# Patient Record
Sex: Male | Born: 1980 | Race: White | Hispanic: No | Marital: Married | Smoking: Never smoker
Health system: Southern US, Community
[De-identification: ages and names within clinical notes are randomized; demographics above are authoritative.]

---
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
## 2015-12-06 ENCOUNTER — Telehealth: Payer: Self-pay | Admitting: Osteopathic Medicine

## 2015-12-06 ENCOUNTER — Ambulatory Visit (INDEPENDENT_AMBULATORY_CARE_PROVIDER_SITE_OTHER): Payer: Managed Care, Other (non HMO)

## 2015-12-06 ENCOUNTER — Ambulatory Visit (INDEPENDENT_AMBULATORY_CARE_PROVIDER_SITE_OTHER): Payer: Managed Care, Other (non HMO) | Admitting: Osteopathic Medicine

## 2015-12-06 ENCOUNTER — Encounter: Payer: Self-pay | Admitting: Osteopathic Medicine

## 2015-12-06 VITALS — BP 124/77 | HR 72 | Ht 71.0 in | Wt 192.0 lb

## 2015-12-06 DIAGNOSIS — Z23 Encounter for immunization: Secondary | ICD-10-CM | POA: Diagnosis not present

## 2015-12-06 DIAGNOSIS — R3989 Other symptoms and signs involving the genitourinary system: Secondary | ICD-10-CM | POA: Diagnosis not present

## 2015-12-06 DIAGNOSIS — Z789 Other specified health status: Secondary | ICD-10-CM

## 2015-12-06 DIAGNOSIS — Z0000 Encounter for general adult medical examination without abnormal findings: Secondary | ICD-10-CM

## 2015-12-06 DIAGNOSIS — R2241 Localized swelling, mass and lump, right lower limb: Secondary | ICD-10-CM

## 2015-12-06 DIAGNOSIS — R229 Localized swelling, mass and lump, unspecified: Secondary | ICD-10-CM | POA: Diagnosis not present

## 2015-12-06 DIAGNOSIS — R17 Unspecified jaundice: Secondary | ICD-10-CM

## 2015-12-06 LAB — CBC WITH DIFFERENTIAL/PLATELET
Basophils Absolute: 0 cells/uL (ref 0–200)
Basophils Relative: 0 %
EOS PCT: 1 %
Eosinophils Absolute: 35 cells/uL (ref 15–500)
HCT: 44.4 % (ref 38.5–50.0)
HEMOGLOBIN: 15.4 g/dL (ref 13.2–17.1)
LYMPHS ABS: 1085 {cells}/uL (ref 850–3900)
Lymphocytes Relative: 31 %
MCH: 31.6 pg (ref 27.0–33.0)
MCHC: 34.7 g/dL (ref 32.0–36.0)
MCV: 91 fL (ref 80.0–100.0)
MONOS PCT: 13 %
MPV: 9.6 fL (ref 7.5–12.5)
Monocytes Absolute: 455 cells/uL (ref 200–950)
NEUTROS ABS: 1925 {cells}/uL (ref 1500–7800)
Neutrophils Relative %: 55 %
PLATELETS: 229 10*3/uL (ref 140–400)
RBC: 4.88 MIL/uL (ref 4.20–5.80)
RDW: 13.2 % (ref 11.0–15.0)
WBC: 3.5 10*3/uL — AB (ref 3.8–10.8)

## 2015-12-06 LAB — URINALYSIS, ROUTINE W REFLEX MICROSCOPIC
BILIRUBIN URINE: NEGATIVE
GLUCOSE, UA: NEGATIVE
Hgb urine dipstick: NEGATIVE
Ketones, ur: NEGATIVE
LEUKOCYTES UA: NEGATIVE
Nitrite: NEGATIVE
PH: 7.5 (ref 5.0–8.0)
PROTEIN: NEGATIVE
SPECIFIC GRAVITY, URINE: 1.017 (ref 1.001–1.035)

## 2015-12-06 NOTE — Patient Instructions (Signed)
Let's plan to follow-up here in the office in 12 months for annual wellness exam If you would like to, you can get lab work done a few days before that visit so that we can go over the results in person at your appointment. You do not need an appointment to go downstairs to the lab for blood draws, they should have the orders in the system for you and if there is any problem they can always call upstairs and we can solve it pretty quickly and get her blood drawn that day.  We will get an ultrasound to further evaluate the mass in the leg - most likely this is benign but the radiologist may recommend biopsy or other imaging based on what they see on the ultrasound. We will call you with results once these are available.   Please let us know if there is anything else we can do for you. Take care! -Dr. Loni Muse.

## 2015-12-06 NOTE — Telephone Encounter (Signed)
Spoke to patient, he does not recall any specific injury to this area of the leg which would result in a myositis ossificans mass/deformity, we'll go ahead and proceed with MRI for definitive diagnosis

## 2015-12-06 NOTE — Progress Notes (Signed)
HPI: Dylan Johnston is a 35 y.o. male who presents to [facility] today for chief complaint of:  Chief Complaint  Patient presents with  . Establish Care    ANNUAL, MASS ON RIGHT CALF     ANNUAL EXAM - SEE PREVENTIVE CARE REVIEWED AS BELOW  MASS ON R CALF . Location: R calf, lateral side . Quality: lump, occasionally sore when running   Duration: been there for years, hasn't really changed  URINARY COMPLAINT - rarely will stop peeing and zip up but then feels a few drops leak. No dribbling stream or feeling of inadequate emptying, no burning or blood in the urine     Past medical, social and family history reviewed: History reviewed. No pertinent past medical history. Past Surgical History  Procedure Laterality Date  . Tonsillectomy  1989   Social History  Substance Use Topics  . Smoking status: Never Smoker   . Smokeless tobacco: Not on file  . Alcohol Use: Not on file   Family History  Problem Relation Age of Onset  . Cancer Mother     BREAST  . Cancer Maternal Grandmother     No current outpatient prescriptions on file.   No current facility-administered medications for this visit.   No Known Allergies    Review of Systems: CONSTITUTIONAL:  No  fever, no chills, No  unintentional weight changes HEAD/EYES/EARS/NOSE/THROAT: No  headache, no vision change, no hearing change, No  sore throat, No  sinus pressure CARDIAC: No  chest pain, No  pressure, No palpitations, No  orthopnea RESPIRATORY: No  cough, No  shortness of breath/wheeze GASTROINTESTINAL: No  nausea, No  vomiting, No  abdominal pain, No  blood in stool, No  diarrhea, No  constipation  MUSCULOSKELETAL: No  myalgia/arthralgia GENITOURINARY: No  incontinence, No  abnormal genital bleeding/discharge SKIN: No  rash/wounds/concerning lesions HEM/ONC: No  easy bruising/bleeding, No  abnormal lymph node ENDOCRINE: No polyuria/polydipsia/polyphagia, No  heat/cold intolerance   NEUROLOGIC: No  weakness, No  dizziness, No  slurred speech PSYCHIATRIC: No  concerns with depression, No  concerns with anxiety, No sleep problems  Exam:  BP 124/77 mmHg  Pulse 72  Ht _0  (1.803 m)  Wt 192 lb (87.091 kg)  BMI 26.79 kg/m2 Constitutional: VS see above. General Appearance: alert, well-developed, well-nourished, NAD Eyes: Normal lids and conjunctive, non-icteric sclera, PERRLA Ears, Nose, Mouth, Throat: MMM, Normal external inspection ears/nares/mouth/lips/gums, TM normal bilaterally. Pharynx no erythema, no exudate.  Neck: No masses, trachea midline. No thyroid enlargement/tenderness/mass appreciated. No lymphadenopathy Respiratory: Normal respiratory effort. no wheeze, no rhonchi, no rales Cardiovascular: S1/S2 normal, no murmur, no rub/gallop auscultated. RRR. No lower extremity edema. Gastrointestinal: Nontender, no masses. No hepatomegaly, no splenomegaly. No hernia appreciated. Bowel sounds normal. Rectal exam deferred.  Musculoskeletal: Gait normal. No clubbing/cyanosis of digits. Subq relatively firm oblong mass near fibular head, mobile, mild tender, no overlying skin changes Neurological: No cranial nerve deficit on limited exam. Motor and sensation intact and symmetric Skin: warm, dry, intact. No rash/ulcer. No concerning nevi or subq nodules on limited exam.   Psychiatric: Normal judgment/insight. Normal mood and affect. Oriented x3.    No results found for this or any previous visit (from the past 72 hour(s)).    ASSESSMENT/PLAN: Will get [place] for further eval mass on leg though suspect benign, however feels a bit firm for lipoma, pt advised may require further evaluation but my suspicion for anything dangerous is low based on how long the lass  has been there and not really changed. Zika testing due to recent travel to endemic area and he and wife are planning to try to get pregnant. Updated Tetanus.   Annual physical exam - Plan: CBC with  Differential/Platelet, COMPLETE METABOLIC PANEL WITH GFR, TSH, Lipid panel  Subcutaneous mass - Plan: [place] Misc Soft Tissue  History of recent foreign travel - Plan: Zika Virus RNA, Qual RT-PCR Pnl,S/U  Urinary problem - Plan: Urinalysis, Routine w reflex microscopic  Need for diphtheria-tetanus-pertussis (Tdap) vaccine, adult/adolescent - Plan: Tdap vaccine greater than or equal to 7yo IM    MALE PREVENTIVE CARE  ANNUAL SCREENING/COUNSELING Tobacco - Never  Alcohol - social drinker Diet/Exercise - HEALTHY HABITS DISCUSSED TO DECREASE CV RISK Sexual Health - Yes with male. STI - The patient denies history of sexually transmitted disease. INTERESTED IN STI TESTING - Zika Depression - PQH2 Negative Domestic violence concerns - no HTN SCREENING - SEE VITALS Vaccination status - SEE BELOW  INFECTIOUS DISEASE SCREENING HIV - all adults 15-65 - does not need GC/CT - sexually active - does not need HepC - born 44-1963 - does not need TB - if risk/required by employer - does not need  DISEASE SCREENING Lipid - (Low risk screen M35; High risk screen M25 if HTN, Tob, FH CHD M<55/F<65) - needs DM2 (45+ or Risk = FH 1st deg DM, Hx GDM, overweight/sedentary, high-risk ethnicity, HTN) - needs Osteoporosis - age 19+ or one sooner if risk - does not need AAA - once age 82-75 if smoker - does not need  CANCER SCREENING Lung - annual low dose CT Chest age 56-85 w/ 30+ PY, current/quit past 15 years - CT - does not need Colon - age 79+ or 33 years of age prior to [place] Dx - GI REFERRAL - does not need Prostate - (shared decision making age 67 in average-risk, age 20 to 63 high risk black men, men with a family history of prostate cancer younger than age 26, BRCA1 or BRCA2) - does not need   ADULT VACCINATION Influenza - annual -  Td booster every 10 years - was given HPV - age <62yo - was not indicated Zoster - age 86+ - was not indicated Pneumonia - age 71+ sooner if risk (DM, smoker,  other) - was not indicated  OTHER Fall - exercise and Vit D age 75+ - does not need Consider ASA - age 44-59 - does not need   All questions were answered. Visit summary with updated medication list and pertinent instructions was printed for patient. ER/RTC precautions were reviewed with the patient. Return in about 1 year (around 12/05/2016), or sooner if needed, for [employer].

## 2015-12-07 LAB — COMPLETE METABOLIC PANEL WITH GFR
ALBUMIN: 4.5 g/dL (ref 3.6–5.1)
ALK PHOS: 47 U/L (ref 40–115)
ALT: 12 U/L (ref 9–46)
AST: 15 U/L (ref 10–40)
BUN: 10 mg/dL (ref 7–25)
CO2: 26 mmol/L (ref 20–31)
Calcium: 9.6 mg/dL (ref 8.6–10.3)
Chloride: 104 mmol/L (ref 98–110)
Creat: 0.89 mg/dL (ref 0.60–1.35)
GFR, Est African American: >89 mL/min (ref >=60)
GFR, Est Non African American: >89 mL/min (ref >=60)
GLUCOSE: 83 mg/dL (ref 65–99)
POTASSIUM: 4.2 mmol/L (ref 3.5–5.3)
SODIUM: 138 mmol/L (ref 135–146)
TOTAL PROTEIN: 7.3 g/dL (ref 6.1–8.1)
Total Bilirubin: 2.2 mg/dL — ABNORMAL HIGH (ref 0.2–1.2)

## 2015-12-07 LAB — LIPID PANEL
CHOL/HDL RATIO: 3.4 ratio (ref <=5.0)
Cholesterol: 172 mg/dL (ref 125–200)
HDL: 51 mg/dL (ref >=40)
LDL Cholesterol: 99 mg/dL (ref <130)
Triglycerides: 111 mg/dL (ref <150)
VLDL: 22 mg/dL (ref <30)

## 2015-12-07 LAB — TSH: TSH: 0.91 mIU/L (ref 0.40–4.50)

## 2015-12-07 NOTE — Addendum Note (Signed)
Addended by: Maryla Morrow on: 12/07/2015 10:14 AM   Modules accepted: Orders

## 2015-12-08 LAB — BILIRUBIN, FRACTIONATED(TOT/DIR/INDIR)
BILIRUBIN INDIRECT: 1.9 mg/dL — AB (ref 0.2–1.2)
Bilirubin, Direct: 0.3 mg/dL — ABNORMAL HIGH (ref <=0.2)
Total Bilirubin: 2.2 mg/dL — ABNORMAL HIGH (ref 0.2–1.2)

## 2015-12-11 LAB — ZIKA VIRUS RNA, QUAL RT-PCR PNL,S/U
ZIKA RNA, URINE: NOT DETECTED
Zika RNA, Serum: NOT DETECTED

## 2016-01-21 ENCOUNTER — Ambulatory Visit (INDEPENDENT_AMBULATORY_CARE_PROVIDER_SITE_OTHER): Payer: Managed Care, Other (non HMO)

## 2016-01-21 DIAGNOSIS — D1621 Benign neoplasm of long bones of right lower limb: Secondary | ICD-10-CM | POA: Diagnosis not present

## 2016-01-21 DIAGNOSIS — R2241 Localized swelling, mass and lump, right lower limb: Secondary | ICD-10-CM

## 2016-01-21 MED ORDER — GADOBENATE DIMEGLUMINE 529 MG/ML IV SOLN
20.0000 mL | Freq: Once | INTRAVENOUS | Status: AC | PRN
  Administered 2016-01-21: 18 mL via INTRAVENOUS

## 2016-07-05 IMAGING — MR MR [PERSON_NAME] LOW WO/W CM*R*
8 of 9 series · 36 of 40 positions shown · IV contrast (multihance)
Comparison: 12/06/2015 ultrasound

CLINICAL DATA: Mass along the proximal fibula.

EXAM:
MRI OF LOWER RIGHT EXTREMITY WITHOUT AND WITH CONTRAST
TECHNIQUE: Multiplanar, multisequence MR imaging of the right tibia/fibula was
performed both before and after administration of intravenous
contrast. Today' s exam was protocol to specifically assess the
region of the mass.
CONTRAST:  18mL MULTIHANCE GADOBENATE DIMEGLUMINE 529 MG/ML IV SOLN

[Series 3: T1 · axial · 6.0mm · 0.86mm/px · z∈[-38,+149]mm · 5 of 27 slices shown (1 of 2)]
[im 1/27]
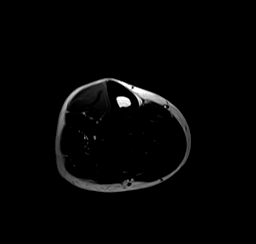
[im 7/27]
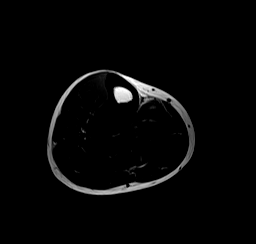
[im 14/27]
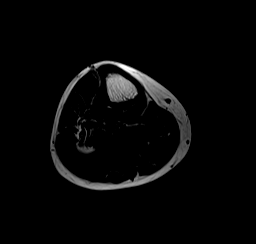
[im 20/27]
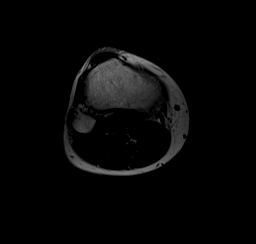
[im 27/27]
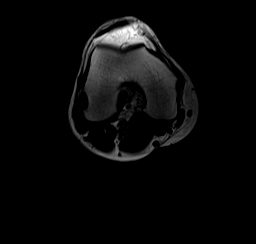

[Series 4: T2 fat-sat · axial · 6.0mm · 0.69mm/px · z∈[-38,+149]mm · 4 of 27 slices shown (1 of 2)]
[im 1/27]
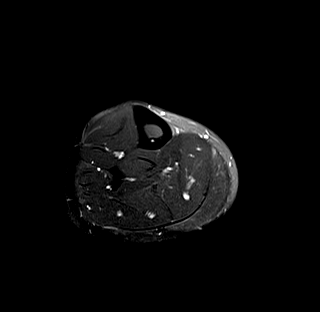
[im 9/27]
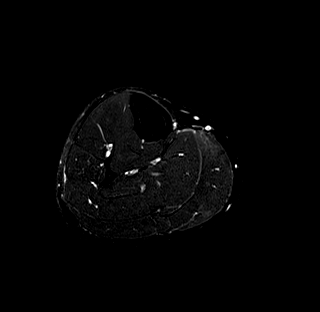
[im 18/27]
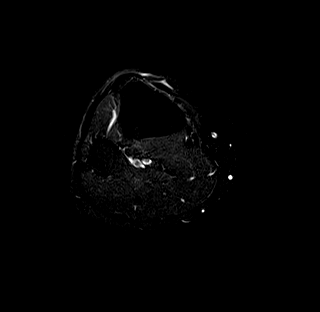
[im 27/27]
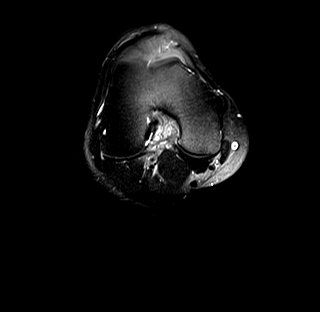

[Series 5: T1 fat-sat · axial · 6.0mm · 0.86mm/px · z∈[-38,+149]mm · 4 of 27 slices shown]
[im 1/27]
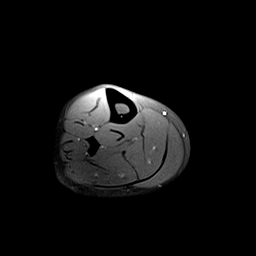
[im 9/27]
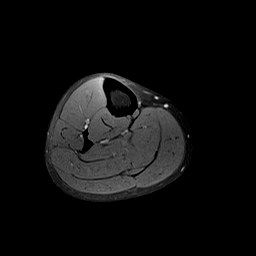
[im 18/27]
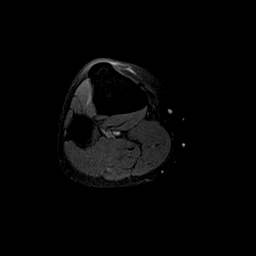
[im 27/27]
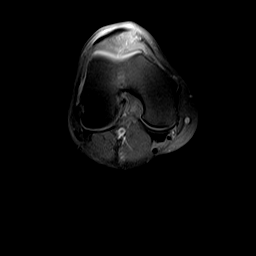

[Series 7: T1 · coronal · 4.0mm · 0.47mm/px · 5 of 32 slices shown (2 of 2)]
[im 1/32]
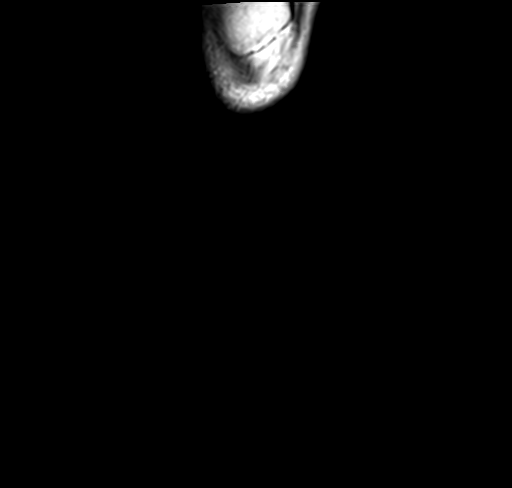
[im 8/32]
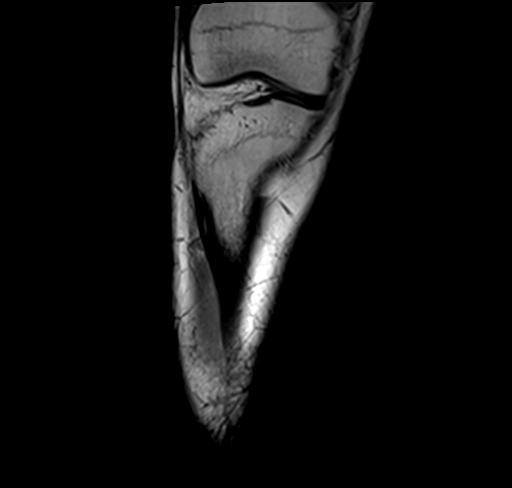
[im 16/32]
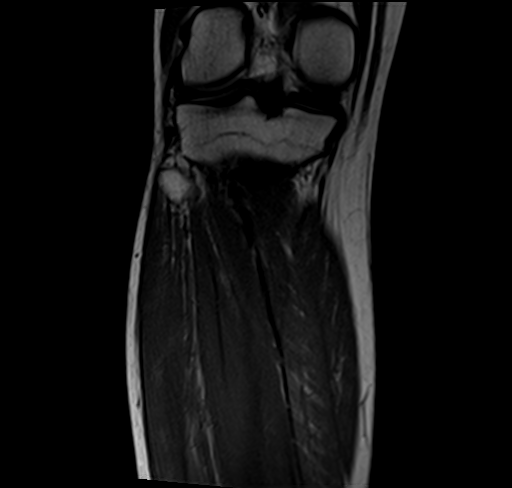
[im 24/32]
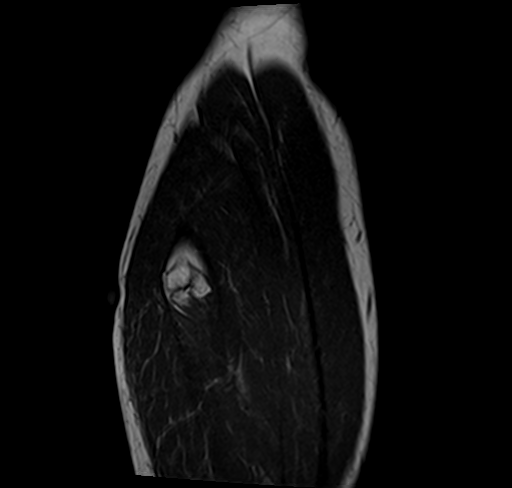
[im 32/32]
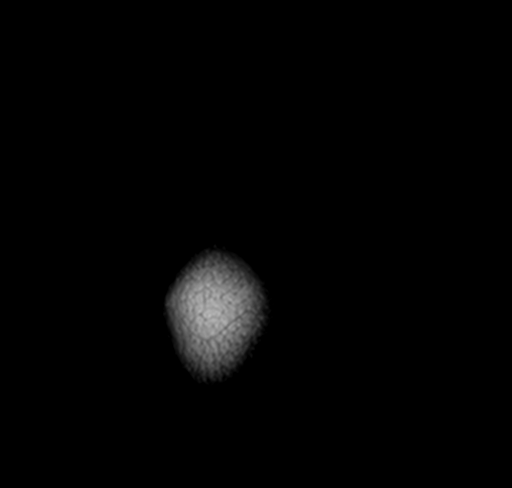

[Series 8: T2 fat-sat · coronal · 4.0mm · 0.75mm/px · 5 of 32 slices shown (2 of 2)]
[im 1/32]
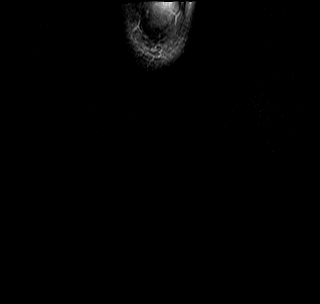
[im 8/32]
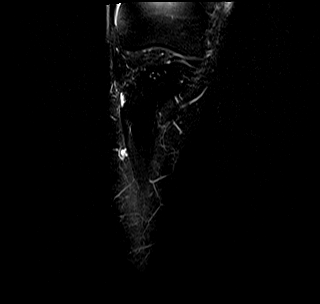
[im 16/32]
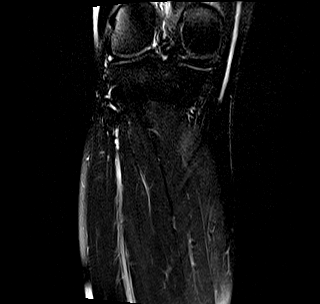
[im 24/32]
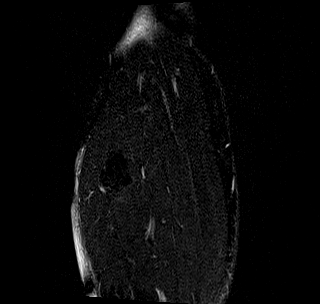
[im 32/32]
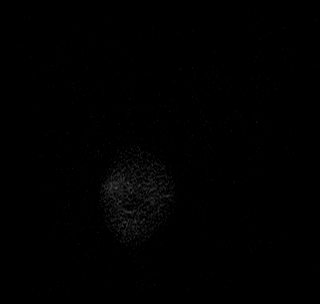

[Series 9: T1 fat-sat post-contrast · axial · 6.0mm · 0.86mm/px · z∈[-38,+149]mm · 4 of 27 slices shown (1 of 3)]
[im 1/27]
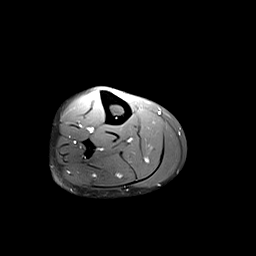
[im 9/27]
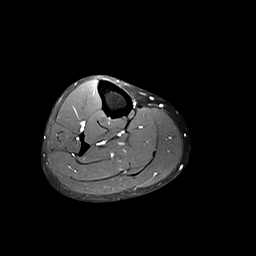
[im 18/27]
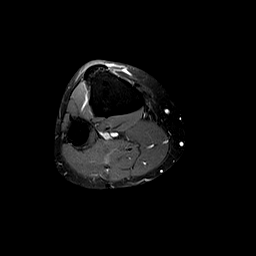
[im 27/27]
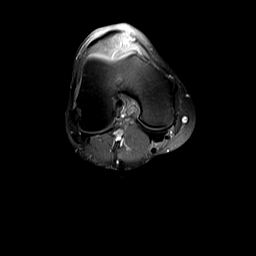

[Series 10: T1 fat-sat post-contrast · coronal · 4.0mm · 0.47mm/px · 5 of 32 slices shown (2 of 3)]
[im 1/32]
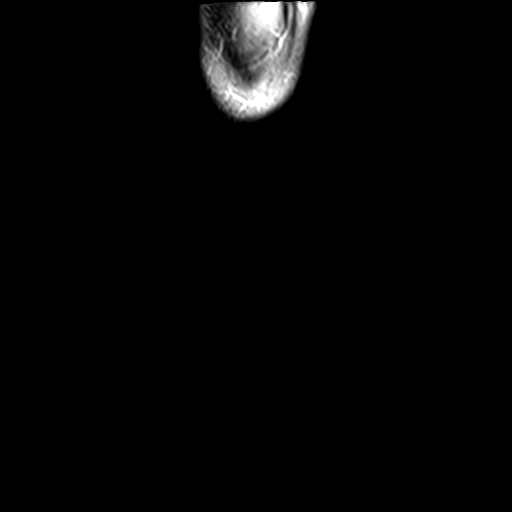
[im 8/32]
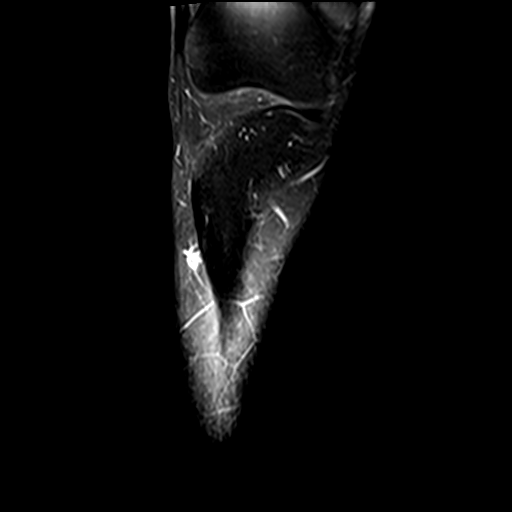
[im 16/32]
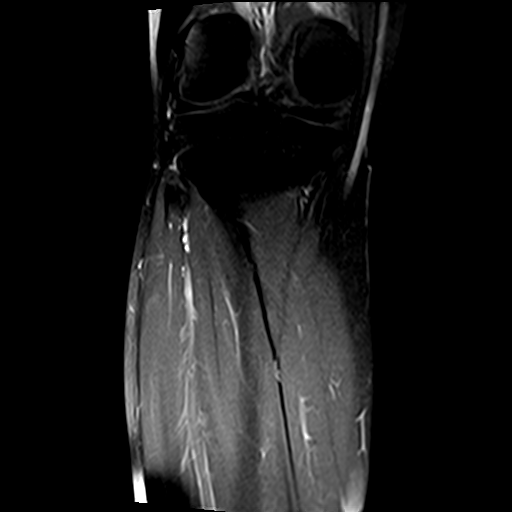
[im 24/32]
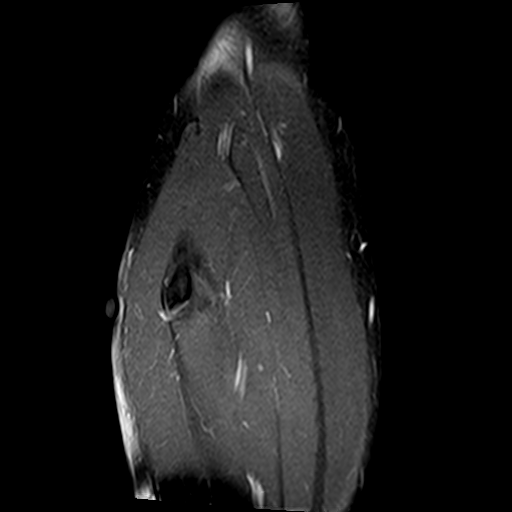
[im 32/32]
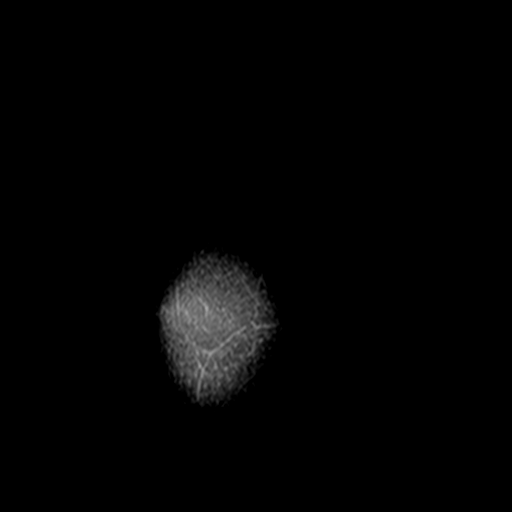

[Series 11: T1 fat-sat post-contrast · sagittal · 5.0mm · 0.69mm/px · 4 of 24 slices shown (3 of 3)]
[im 1/24]
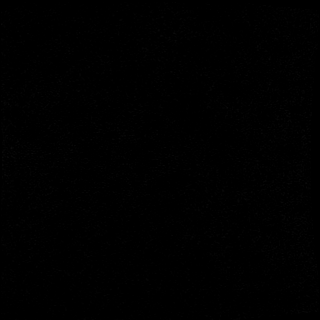
[im 8/24]
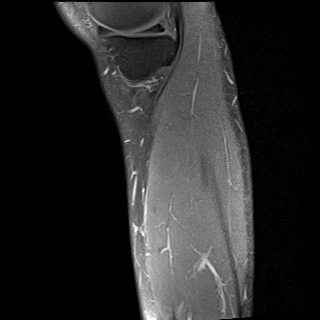
[im 16/24]
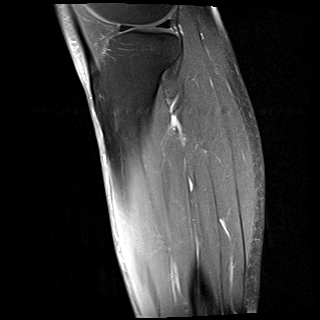
[im 24/24]
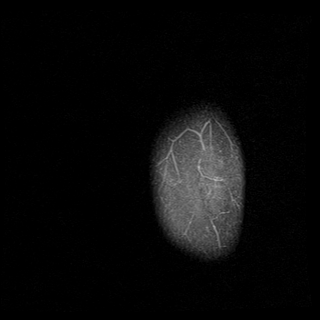

[36 of 40 positions shown; findings below may reference images not displayed]

FINDINGS: Extending posterolaterally from they proximal fibular metadiaphysis
adjacent to the soleus muscle, there is a 2.4 by 1.6 by 1.8 cm
osteochondroma of the proximal fibular metadiaphysis. This does not
have a thick cartilaginous cap and appears both ossific into shear
A. medullary space with the fibula, characteristic of
osteochondroma. I do not see localized edema in the soft tissues or
surrounding fluid signal to suggest irritation of the adjacent
musculature. This is not in a position to impinge on neurovascular
structures. No abnormal osseous edema.
IMPRESSION: 1. The palpable abnormality corresponds to a benign-appearing
osteochondroma of the proximal fibular metadiaphysis extending
posterolaterally. No thick cartilaginous cap. No surrounding edema
in the soft tissues to suggest local irritation.
